# Patient Record
Sex: Male | Born: 1985 | Race: Black or African American | Hispanic: No | Marital: Married | Smoking: Current every day smoker
Health system: Southern US, Community
[De-identification: ages and names within clinical notes are randomized; demographics above are authoritative.]

## PROBLEM LIST (undated history)

## (undated) DIAGNOSIS — H00019 Hordeolum externum unspecified eye, unspecified eyelid: Secondary | ICD-10-CM

## (undated) DIAGNOSIS — L089 Local infection of the skin and subcutaneous tissue, unspecified: Secondary | ICD-10-CM

## (undated) DIAGNOSIS — S60459A Superficial foreign body of unspecified finger, initial encounter: Secondary | ICD-10-CM

---
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
## 2008-07-16 ENCOUNTER — Ambulatory Visit: Payer: Self-pay | Admitting: Family Medicine

## 2012-06-11 ENCOUNTER — Emergency Department (HOSPITAL_COMMUNITY): Payer: Self-pay

## 2012-06-11 ENCOUNTER — Encounter (HOSPITAL_COMMUNITY): Payer: Self-pay | Admitting: *Deleted

## 2012-06-11 ENCOUNTER — Emergency Department (HOSPITAL_COMMUNITY)
Admission: EM | Admit: 2012-06-11 | Discharge: 2012-06-11 | Disposition: A | Discharge status: Home / Self Care | Payer: Self-pay | Attending: Emergency Medicine | Admitting: Emergency Medicine

## 2012-06-11 DIAGNOSIS — M795 Residual foreign body in soft tissue: Secondary | ICD-10-CM | POA: Insufficient documentation

## 2012-06-11 DIAGNOSIS — S60459A Superficial foreign body of unspecified finger, initial encounter: Secondary | ICD-10-CM

## 2012-06-11 DIAGNOSIS — IMO0001 Reserved for inherently not codable concepts without codable children: Secondary | ICD-10-CM | POA: Insufficient documentation

## 2012-06-11 DIAGNOSIS — L02519 Cutaneous abscess of unspecified hand: Secondary | ICD-10-CM | POA: Insufficient documentation

## 2012-06-11 DIAGNOSIS — F17200 Nicotine dependence, unspecified, uncomplicated: Secondary | ICD-10-CM | POA: Insufficient documentation

## 2012-06-11 DIAGNOSIS — IMO0002 Reserved for concepts with insufficient information to code with codable children: Secondary | ICD-10-CM | POA: Insufficient documentation

## 2012-06-11 MED ORDER — CEPHALEXIN 500 MG PO CAPS: 500.0000 mg | ORAL_CAPSULE | Freq: Four times a day (QID) | ORAL | Status: DC
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
## 2012-06-12 NOTE — ED Provider Notes (Signed)
Medical screening examination/treatment/procedure(s) were performed by non-physician practitioner and as supervising physician I was immediately available for consultation/collaboration.  Italia Wolfert T Dhanvi Boesen, MD 06/12/12 1517 

## 2012-06-15 ENCOUNTER — Emergency Department (HOSPITAL_COMMUNITY): Payer: Medicaid Other | Admitting: Anesthesiology

## 2012-06-15 ENCOUNTER — Encounter (HOSPITAL_COMMUNITY): Admission: EM | Disposition: A | Discharge status: Home / Self Care | Payer: Self-pay | Source: Home / Self Care

## 2012-06-15 ENCOUNTER — Encounter (HOSPITAL_COMMUNITY): Payer: Self-pay | Admitting: *Deleted

## 2012-06-15 ENCOUNTER — Encounter (HOSPITAL_COMMUNITY): Payer: Self-pay | Admitting: Anesthesiology

## 2012-06-15 ENCOUNTER — Emergency Department (HOSPITAL_COMMUNITY)
Admission: EM | Admit: 2012-06-15 | Discharge: 2012-06-15 | Disposition: A | Discharge status: Home / Self Care | Payer: Medicaid Other | Attending: Orthopedic Surgery | Admitting: Orthopedic Surgery

## 2012-06-15 ENCOUNTER — Other Ambulatory Visit: Payer: Self-pay | Admitting: Orthopedic Surgery

## 2012-06-15 DIAGNOSIS — Z1812 Retained nonmagnetic metal fragments: Secondary | ICD-10-CM | POA: Insufficient documentation

## 2012-06-15 DIAGNOSIS — L02519 Cutaneous abscess of unspecified hand: Secondary | ICD-10-CM | POA: Insufficient documentation

## 2012-06-15 DIAGNOSIS — L03019 Cellulitis of unspecified finger: Secondary | ICD-10-CM | POA: Insufficient documentation

## 2012-06-15 DIAGNOSIS — M795 Residual foreign body in soft tissue: Secondary | ICD-10-CM | POA: Insufficient documentation

## 2012-06-15 HISTORY — DX: Local infection of the skin and subcutaneous tissue, unspecified: L08.9

## 2012-06-15 HISTORY — PX: INCISION AND DRAINAGE WOUND WITH FOREIGN BODY REMOVAL: SHX5635

## 2012-06-15 HISTORY — DX: Superficial foreign body of unspecified finger, initial encounter: S60.459A

## 2012-06-15 HISTORY — DX: Hordeolum externum unspecified eye, unspecified eyelid: H00.019

## 2012-06-15 SURGERY — INCISION AND DRAINAGE WOUND WITH FOREIGN BODY REMOVAL
Anesthesia: General | Site: Finger | Laterality: Left | Wound class: Clean

## 2012-06-15 MED ORDER — BUPIVACAINE HCL (PF) 0.25 % IJ SOLN
INTRAMUSCULAR | Status: AC
  Filled 2012-06-15: qty 30

## 2012-06-15 MED ORDER — PROPOFOL 10 MG/ML IV BOLUS
INTRAVENOUS | Status: DC | PRN
  Administered 2012-06-15: 200 mg via INTRAVENOUS
  Administered 2012-06-15: 50 mg via INTRAVENOUS

## 2012-06-15 MED ORDER — POTASSIUM CHLORIDE IN NACL 20-0.45 MEQ/L-% IV SOLN
INTRAVENOUS | Status: DC
  Administered 2012-06-15: 12:00:00 via INTRAVENOUS
  Filled 2012-06-15 (×2): qty 1000

## 2012-06-15 MED ORDER — CHLORHEXIDINE GLUCONATE 4 % EX LIQD
60.0000 mL | Freq: Once | CUTANEOUS | Status: AC
  Administered 2012-06-15: 4 via TOPICAL

## 2012-06-15 MED ORDER — MEPERIDINE HCL 25 MG/ML IJ SOLN: 6.2500 mg | INTRAMUSCULAR | Status: DC | PRN

## 2012-06-15 MED ORDER — LIDOCAINE HCL (CARDIAC) 20 MG/ML IV SOLN
INTRAVENOUS | Status: DC | PRN
  Administered 2012-06-15: 60 mg via INTRAVENOUS

## 2012-06-15 MED ORDER — FENTANYL CITRATE 0.05 MG/ML IJ SOLN
INTRAMUSCULAR | Status: DC | PRN
  Administered 2012-06-15: 50 ug via INTRAVENOUS
  Administered 2012-06-15: 100 ug via INTRAVENOUS

## 2012-06-15 MED ORDER — HYDROMORPHONE HCL PF 1 MG/ML IJ SOLN: 0.2500 mg | INTRAMUSCULAR | Status: DC | PRN

## 2012-06-15 MED ORDER — SULFAMETHOXAZOLE-TRIMETHOPRIM 800-160 MG PO TABS: 1.0000 | ORAL_TABLET | Freq: Two times a day (BID) | ORAL | Status: DC

## 2012-06-15 MED ORDER — CEFAZOLIN SODIUM-DEXTROSE 2-3 GM-% IV SOLR
2.0000 g | INTRAVENOUS | Status: AC
  Administered 2012-06-15: 2 g via INTRAVENOUS
  Filled 2012-06-15: qty 50

## 2012-06-15 MED ORDER — PROMETHAZINE HCL 25 MG/ML IJ SOLN: 6.2500 mg | INTRAMUSCULAR | Status: DC | PRN

## 2012-06-15 MED ORDER — MIDAZOLAM HCL 5 MG/5ML IJ SOLN
INTRAMUSCULAR | Status: DC | PRN
  Administered 2012-06-15: 2 mg via INTRAVENOUS

## 2012-06-15 MED ORDER — LACTATED RINGERS IV SOLN
INTRAVENOUS | Status: DC | PRN
  Administered 2012-06-15: 13:00:00 via INTRAVENOUS

## 2012-06-15 MED ORDER — HYDROCODONE-ACETAMINOPHEN 5-325 MG PO TABS: 1.0000 | ORAL_TABLET | Freq: Four times a day (QID) | ORAL | Status: DC | PRN

## 2012-06-15 MED ORDER — OXYCODONE HCL 5 MG PO TABS: 5.0000 mg | ORAL_TABLET | Freq: Once | ORAL | Status: DC | PRN

## 2012-06-15 MED ORDER — OXYCODONE HCL 5 MG/5ML PO SOLN: 5.0000 mg | Freq: Once | ORAL | Status: DC | PRN

## 2012-06-15 SURGICAL SUPPLY — 21 items
BNDG COHESIVE 1X5 TAN STRL LF (GAUZE/BANDAGES/DRESSINGS) ×2 IMPLANT
CLOTH BEACON ORANGE TIMEOUT ST (SAFETY) ×2 IMPLANT
COVER SURGICAL LIGHT HANDLE (MISCELLANEOUS) ×2 IMPLANT
DRAPE C-ARM MINI 42X72 WSTRAPS (DRAPES) ×2 IMPLANT
DRSG EMULSION OIL 3X3 NADH (GAUZE/BANDAGES/DRESSINGS) ×2 IMPLANT
DRSG TUBE GAUZE 1X5YD SZ2 (GAUZE/BANDAGES/DRESSINGS) ×2 IMPLANT
GLOVE BIO SURGEON STRL SZ8 (GLOVE) ×2 IMPLANT
GLOVE BIOGEL M STRL SZ7.5 (GLOVE) ×2 IMPLANT
GLOVE BIOGEL PI IND STRL 8 (GLOVE) ×1 IMPLANT
GLOVE BIOGEL PI INDICATOR 8 (GLOVE) ×1
GOWN STRL REIN XL XLG (GOWN DISPOSABLE) ×6 IMPLANT
KIT BASIN OR (CUSTOM PROCEDURE TRAY) ×2 IMPLANT
NEEDLE HYPO 25GX1X1/2 BEV (NEEDLE) IMPLANT
PACK ORTHO EXTREMITY (CUSTOM PROCEDURE TRAY) ×2 IMPLANT
SET CYSTO W/LG BORE CLAMP LF (SET/KITS/TRAYS/PACK) ×2 IMPLANT
SPONGE GAUZE 4X4 12PLY (GAUZE/BANDAGES/DRESSINGS) ×2 IMPLANT
SUCTION FRAZIER TIP 10 FR DISP (SUCTIONS) ×2 IMPLANT
SUT CHROMIC 5 0 RB 1 27 (SUTURE) ×2 IMPLANT
SYR CONTROL 10ML LL (SYRINGE) IMPLANT
TOWEL OR 17X24 6PK STRL BLUE (TOWEL DISPOSABLE) ×2 IMPLANT
TOWEL OR 17X26 10 PK STRL BLUE (TOWEL DISPOSABLE) ×2 IMPLANT
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
## 2012-06-15 NOTE — ED Notes (Signed)
Dr. Amanda Pea called x 2

## 2012-06-15 NOTE — Op Note (Signed)
See Dictation [id number] Christian Severin MD

## 2012-06-15 NOTE — Anesthesia Postprocedure Evaluation (Signed)
  Anesthesia Post-op Note  Patient: [name]  Procedure(s) Performed: Procedure(s) (LRB) with comments: INCISION AND DRAINAGE WOUND WITH FOREIGN BODY REMOVAL (Left) - index   Patient Location: PACU  Anesthesia Type:General  Level of Consciousness: awake  Airway and Oxygen Therapy: Patient Spontanous Breathing  Post-op Pain: none  Post-op Assessment: Post-op Vital signs reviewed  Post-op Vital Signs: stable  Complications: No apparent anesthesia complications

## 2012-06-15 NOTE — Anesthesia Preprocedure Evaluation (Addendum)
Anesthesia Evaluation  Patient identified by MRN, date of birth, ID band Patient awake    Reviewed: Allergy & Precautions, H&P , NPO status , Patient's Chart, lab work & pertinent test results  History of Anesthesia Complications Negative for: history of anesthetic complications  Airway Mallampati: I  Neck ROM: full    Dental No notable dental hx. (+) Teeth Intact   Pulmonary neg pulmonary ROS,  breath sounds clear to auscultation  Pulmonary exam normal       Cardiovascular negative cardio ROS  IRhythm:regular Rate:Normal     Neuro/Psych negative neurological ROS  negative psych ROS   GI/Hepatic negative GI ROS, Neg liver ROS,   Endo/Other  negative endocrine ROS  Renal/GU negative Renal ROS  negative genitourinary   Musculoskeletal   Abdominal   Peds  Hematology negative hematology ROS (+)   Anesthesia Other Findings   Reproductive/Obstetrics negative OB ROS                           Anesthesia Physical Anesthesia Plan  ASA: I and emergent  Anesthesia Plan: General   Post-op Pain Management:    Induction:   Airway Management Planned:   Additional Equipment:   Intra-op Plan:   Post-operative Plan:   Informed Consent: I have reviewed the patients History and Physical, chart, labs and discussed the procedure including the risks, benefits and alternatives for the proposed anesthesia with the patient or authorized representative who has indicated his/her understanding and acceptance.     Plan Discussed with: CRNA and Surgeon  Anesthesia Plan Comments:         Anesthesia Quick Evaluation

## 2012-06-15 NOTE — Anesthesia Procedure Notes (Signed)
Procedure Name: LMA Insertion Date/Time: 06/15/2012 1:24 PM Performed by: Alanda Amass A Pre-anesthesia Checklist: Patient identified, Timeout performed, Emergency Drugs available, Suction available and Patient being monitored Patient Re-evaluated:Patient Re-evaluated prior to inductionOxygen Delivery Method: Circle system utilized Preoxygenation: Pre-oxygenation with 100% oxygen Intubation Type: IV induction LMA: LMA inserted LMA Size: 5.0 Number of attempts: 1 Placement Confirmation: positive ETCO2 and breath sounds checked- equal and bilateral Tube secured with: Tape Dental Injury: Teeth and Oropharynx as per pre-operative assessment
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
## 2012-06-15 NOTE — ED Notes (Signed)
Called to the OR and left a message for Dr. Amanda Pea to call before going into surgery.
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
## 2012-06-15 NOTE — Transfer of Care (Signed)
Immediate Anesthesia Transfer of Care Note  Patient: [name]  Procedure(s) Performed: Procedure(s) (LRB) with comments: INCISION AND DRAINAGE WOUND WITH FOREIGN BODY REMOVAL (Left) - index   Patient Location: PACU  Anesthesia Type:General  Level of Consciousness: sedated  Airway & Oxygen Therapy: Patient Spontanous Breathing  Post-op Assessment: Report given to PACU RN  Post vital signs: Reviewed and stable  Complications: No apparent anesthesia complications

## 2012-06-17 ENCOUNTER — Encounter (HOSPITAL_COMMUNITY): Payer: Self-pay | Admitting: Orthopedic Surgery

## 2012-06-18 LAB — CULTURE, ROUTINE-ABSCESS: Culture: NO GROWTH

## 2012-06-20 LAB — ANAEROBIC CULTURE

## 2017-07-26 ENCOUNTER — Emergency Department (HOSPITAL_COMMUNITY): Payer: BLUE CROSS/BLUE SHIELD

## 2017-07-26 ENCOUNTER — Encounter (HOSPITAL_COMMUNITY): Payer: Self-pay | Admitting: Emergency Medicine

## 2017-07-26 ENCOUNTER — Emergency Department (HOSPITAL_COMMUNITY)
Admission: EM | Admit: 2017-07-26 | Discharge: 2017-07-26 | Disposition: A | Discharge status: Home / Self Care | Payer: BLUE CROSS/BLUE SHIELD | Attending: Emergency Medicine | Admitting: Emergency Medicine

## 2017-07-26 DIAGNOSIS — J111 Influenza due to unidentified influenza virus with other respiratory manifestations: Secondary | ICD-10-CM | POA: Diagnosis not present

## 2017-07-26 DIAGNOSIS — Z79899 Other long term (current) drug therapy: Secondary | ICD-10-CM | POA: Diagnosis not present

## 2017-07-26 DIAGNOSIS — F17210 Nicotine dependence, cigarettes, uncomplicated: Secondary | ICD-10-CM | POA: Insufficient documentation

## 2017-07-26 DIAGNOSIS — R0602 Shortness of breath: Secondary | ICD-10-CM | POA: Diagnosis present

## 2017-07-26 LAB — INFLUENZA PANEL BY PCR (TYPE A & B)
Influenza A By PCR: POSITIVE — AB
Influenza B By PCR: NEGATIVE

## 2017-07-26 MED ORDER — IBUPROFEN 800 MG PO TABS
800.0000 mg | ORAL_TABLET | Freq: Once | ORAL | Status: AC
Start: 2017-07-26 — End: 2017-07-26
  Administered 2017-07-26: 800 mg via ORAL
  Filled 2017-07-26: qty 1

## 2017-07-26 MED ORDER — ACETAMINOPHEN 325 MG PO TABS
650.0000 mg | ORAL_TABLET | Freq: Once | ORAL | Status: AC | PRN
  Administered 2017-07-26: 650 mg via ORAL
  Filled 2017-07-26: qty 2

## 2017-07-26 MED ORDER — OSELTAMIVIR PHOSPHATE 75 MG PO CAPS
75.0000 mg | ORAL_CAPSULE | Freq: Once | ORAL | Status: AC
  Administered 2017-07-26: 75 mg via ORAL
  Filled 2017-07-26: qty 1

## 2017-07-26 MED ORDER — SODIUM CHLORIDE 0.9 % IV BOLUS (SEPSIS)
1000.0000 mL | Freq: Once | INTRAVENOUS | Status: AC
  Administered 2017-07-26: 1000 mL via INTRAVENOUS

## 2017-07-26 MED ORDER — OSELTAMIVIR PHOSPHATE 75 MG PO CAPS: 75.0000 mg | ORAL_CAPSULE | Freq: Two times a day (BID) | ORAL | 0 refills | Status: DC

## 2017-07-26 MED ORDER — IPRATROPIUM-ALBUTEROL 0.5-2.5 (3) MG/3ML IN SOLN
3.0000 mL | Freq: Once | RESPIRATORY_TRACT | Status: AC
  Administered 2017-07-26: 3 mL via RESPIRATORY_TRACT
  Filled 2017-07-26: qty 3

## 2017-07-26 NOTE — ED Triage Notes (Signed)
Ptpresents to ED for assessment of shortness of breath with exertion, facial rash, fevers, body aches, headache, cough with phlegm.  Decrease in appetite, not eating or drinking the past few days.  Febrile and tachycardic at triage.

## 2017-07-26 NOTE — ED Notes (Addendum)
Once pt finished breathing treatment, pt ambulated in the hallway. Pt's O2 was 91%-93%. Informed Lynnze - RN.

## 2017-07-26 NOTE — ED Provider Notes (Signed)
[facility]   CSN: [id number] Arrival date & time: 07/26/17  1155  History   Chief Complaint Chief Complaint  Patient presents with  . Shortness of Breath    HPI [name] is a 32 y.o. male.  HPI    32 year old male presents today with complaints of flulike symptoms.  Patient notes a significant past medical history of asthma, reports that he does not generally use an inhaler.  He reports 3 weeks ago he had an upper respiratory infection that resolved with steroids and breathing treatment.  Patient notes that he was in his usual state of health with very minimal wheezing over the last week.  He notes 2 days ago he had acute onset of fever, body aches, runny nose congestion and cough.  Patient has had decreased appetite with very little p.o. intake.  He notes taking Tylenol several hours prior to evaluation.  Patient notes that he does not have an inhaler at home as he does not need this consistently.  Patient denies any other significant past medical history.  Patient reports he did not get the flu vaccine this year.  Patient notes a rash to his face, he reports this typically presents with respiratory infections.    Past Medical History:  Diagnosis Date  . Foreign body in finger-infected 2000  . Stye     There are no active problems to display for this patient.   Past Surgical History:  Procedure Laterality Date  . INCISION AND DRAINAGE WOUND WITH FOREIGN BODY REMOVAL  06/15/2012   Procedure: INCISION AND DRAINAGE WOUND WITH FOREIGN BODY REMOVAL;  Surgeon: Dominica SeverinWilliam Gramig, MD;  Location: [facility] OR;  Service: Orthopedics;  Laterality: Left;  index       Home Medications    Prior to Admission medications   Medication Sig Start Date End Date Taking? Authorizing Provider  cephALEXin (KEFLEX) 500 MG capsule Take 1 capsule (500 mg total) by mouth 4 (four) times daily. 06/11/12   Santiago GladLaisure, Heather, PA-C    HYDROcodone-acetaminophen (NORCO) 5-325 MG per tablet Take 1 tablet by mouth every 6 (six) hours as needed for pain. 06/15/12   Dominica SeverinGramig, William, MD  oseltamivir (TAMIFLU) 75 MG capsule Take 1 capsule (75 mg total) by mouth every 12 (twelve) hours. 07/26/17   Deairra Halleck, Tinnie GensJeffrey, PA-C  sulfamethoxazole-trimethoprim (BACTRIM DS) 800-160 MG per tablet Take 1 tablet by mouth 2 (two) times daily. 06/15/12   Dominica SeverinGramig, William, MD    Family History History reviewed. No pertinent family history.  Social History Social History   Tobacco Use  . Smoking status: Current Every Day Smoker    Packs/day: 0.25    Years: 2.00    Pack years: 0.50    Types: Cigarettes  . Smokeless tobacco: Never Used  Substance Use Topics  . Alcohol use: Yes    Comment: 1 drink/ day  . Drug use: No     Allergies   Patient has no known allergies.   Review of Systems Review of Systems  All other systems reviewed and are negative.  Physical Exam Updated Vital Signs BP 123/83 (BP Location: Left Arm)   Pulse 83   Temp 98.2 F (36.8 C) (Oral)   Resp 16   SpO2 94%   Physical Exam  Constitutional: He is oriented to person, place, and time. He appears well-developed and well-nourished.  HENT:  Head: Normocephalic and atraumatic.  Pruritic rash noted to face  Eyes: Conjunctivae are normal. Pupils are equal, round,  and reactive to light. Right eye exhibits no discharge. Left eye exhibits no discharge. No scleral icterus.  Neck: Normal range of motion. No JVD present. No tracheal deviation present.  Pulmonary/Chest: Effort normal and breath sounds normal. No stridor. No respiratory distress. He has no wheezes. He has no rales. He exhibits no tenderness.  Neurological: He is alert and oriented to person, place, and time. Coordination normal.  Psychiatric: He has a normal mood and affect. His behavior is normal. Judgment and thought content normal.  Nursing note and vitals reviewed.    ED Treatments / Results   Labs (all labs ordered are listed, but only abnormal results are displayed) Labs Reviewed  INFLUENZA PANEL BY PCR (TYPE A & B) - Abnormal; Notable for the following components:      Result Value   Influenza A By PCR POSITIVE (*)    All other components within normal limits    EKG  EKG Interpretation None       Radiology Dg Chest 2 View  Result Date: 07/26/2017 CLINICAL DATA:  Chest tightness, shortness of breath, fever, chills, and body aches for the past 2 days. Wheezing for the past 2 weeks. Three days of headache. Patient is a current smoker and has history of asthma. EXAM: CHEST  2 VIEW COMPARISON:  Chest x-ray of July 16, 2008 FINDINGS: The lungs are well-expanded. There is mild interstitial prominence bilaterally. There is no alveolar infiltrate or pleural effusion. The heart and mediastinal structures are normal. The bony thorax exhibits no acute abnormality. IMPRESSION: Mild interstitial prominence likely reflects the history of reactive airway disease and smoking. No alveolar pneumonia nor other acute cardiopulmonary abnormality. Electronically Signed   By: David  [place] M.D.   On: 07/26/2017 12:52    Procedures Procedures (including critical care time)  Medications Ordered in ED Medications  acetaminophen (TYLENOL) tablet 650 mg (650 mg Oral Given 07/26/17 1227)  ibuprofen (ADVIL,MOTRIN) tablet 800 mg (800 mg Oral Given 07/26/17 1530)  ipratropium-albuterol (DUONEB) 0.5-2.5 (3) MG/3ML nebulizer solution 3 mL (3 mLs Nebulization Given 07/26/17 1530)  sodium chloride 0.9 % bolus 1,000 mL (0 mLs Intravenous Stopped 07/26/17 1626)  oseltamivir (TAMIFLU) capsule 75 mg (75 mg Oral Given 07/26/17 1735)  ipratropium-albuterol (DUONEB) 0.5-2.5 (3) MG/3ML nebulizer solution 3 mL (3 mLs Nebulization Given 07/26/17 1735)     Initial Impression / Assessment and Plan / ED Course  I have reviewed the triage vital signs and the nursing notes.  Pertinent labs & imaging results that  were available during my care of the patient were reviewed by me and considered in my medical decision making (see chart for details).      Final Clinical Impressions(s) / ED Diagnoses   Final diagnoses:  Influenza    Labs:   Imaging: DG chest 2 view   Consults:  Therapeutics: DuoNeb x2, Tamiflu  Discharge Meds: Tamiflu  Assessment/Plan: 32 year old male presents today with likely influenza.  Patient is in no acute distress, slightly tachycardic and febrile.  Patient does have a history of asthma, he will be tested and prophylactically started on Tamiflu pending his results.  Patient's chest x-rays are reassuring with no signs of acute pneumonia.  He has reassuring lung sounds.  Patient requesting a breathing treatment, he will be given this, reassessed with likely disposition home.  Patient reassessed reports he is feeling much better.  He has clear lung sounds again, O2 sensor was adjusted with 98% oxygen saturation no respiratory distress.  Patient ambulating the hall without dyspnea.  Patient is flu positive he will be treated with Tamiflu, patient's family at bedside, he is given strict return precautions, he verbalized understanding and agreement to today's plan had no further questions or concerns at time of discharge.         ED Discharge Orders        Ordered    oseltamivir (TAMIFLU) 75 MG capsule  Every 12 hours     07/26/17 1717       Eyvonne Mechanic, PA-C 07/26/17 1857    Nazareth Kirk, Tinnie Gens, PA-C 07/26/17 1917    Mancel Bale, MD 07/27/17 1124

## 2017-07-26 NOTE — Discharge Instructions (Signed)
Please read attached information. If you experience any new or worsening signs or symptoms please return to the emergency room for evaluation. Please follow-up with your primary care provider or specialist as discussed. Please use medication prescribed only as directed and discontinue taking if you have any concerning signs or symptoms.   °

## 2017-07-26 NOTE — ED Notes (Signed)
Pt ambulated to room with no difficulty.  

## 2017-08-09 ENCOUNTER — Other Ambulatory Visit: Payer: Self-pay | Admitting: Obstetrics and Gynecology

## 2017-08-09 DIAGNOSIS — J45909 Unspecified asthma, uncomplicated: Secondary | ICD-10-CM

## 2017-08-10 ENCOUNTER — Ambulatory Visit: Payer: BLUE CROSS/BLUE SHIELD

## 2017-08-10 DIAGNOSIS — J45909 Unspecified asthma, uncomplicated: Secondary | ICD-10-CM

## 2017-08-10 LAB — PULMONARY FUNCTION TEST
DL/VA % pred: 101 %
DL/VA: 4.9 ml/min/mmHg/L
DLCO UNC % PRED: 75 %
DLCO unc: 27.43 ml/min/mmHg
FEF 25-75 PRE: 1.1 L/s
FEF 25-75 Post: 1.3 L/sec
FEF2575-%CHANGE-POST: 17 %
FEF2575-%PRED-POST: 29 %
FEF2575-%Pred-Pre: 25 %
FEV1-%CHANGE-POST: 5 %
FEV1-%Pred-Post: 55 %
FEV1-%Pred-Pre: 52 %
FEV1-POST: 2.3 L
FEV1-Pre: 2.18 L
FEV1FVC-%CHANGE-POST: 0 %
FEV1FVC-%Pred-Pre: 68 %
FEV6-%CHANGE-POST: 6 %
FEV6-%Pred-Post: 82 %
FEV6-%Pred-Pre: 77 %
FEV6-PRE: 3.82 L
FEV6-Post: 4.07 L
FEV6FVC-%Change-Post: 0 %
FEV6FVC-%PRED-PRE: 100 %
FEV6FVC-%Pred-Post: 100 %
FVC-%CHANGE-POST: 6 %
FVC-%PRED-POST: 81 %
FVC-%Pred-Pre: 77 %
FVC-PRE: 3.84 L
FVC-Post: 4.08 L
POST FEV1/FVC RATIO: 56 %
Post FEV6/FVC ratio: 100 %
Pre FEV1/FVC ratio: 57 %
Pre FEV6/FVC Ratio: 99 %
RV % PRED: 150 %
RV: 2.77 L
TLC % pred: 87 %
TLC: 6.58 L

## 2018-08-07 ENCOUNTER — Ambulatory Visit (HOSPITAL_COMMUNITY)
Admission: EM | Admit: 2018-08-07 | Discharge: 2018-08-07 | Disposition: A | Discharge status: Home / Self Care | Payer: BLUE CROSS/BLUE SHIELD | Attending: Family Medicine | Admitting: Family Medicine

## 2018-08-07 ENCOUNTER — Encounter (HOSPITAL_COMMUNITY): Payer: Self-pay | Admitting: Emergency Medicine

## 2018-08-07 ENCOUNTER — Other Ambulatory Visit: Payer: Self-pay

## 2018-08-07 DIAGNOSIS — M62838 Other muscle spasm: Secondary | ICD-10-CM

## 2018-08-07 DIAGNOSIS — M542 Cervicalgia: Secondary | ICD-10-CM | POA: Diagnosis not present

## 2018-08-07 MED ORDER — DICLOFENAC SODIUM 75 MG PO TBEC: 75.0000 mg | DELAYED_RELEASE_TABLET | Freq: Two times a day (BID) | ORAL | 0 refills | Status: DC

## 2018-08-07 MED ORDER — DIAZEPAM 10 MG PO TABS: 10.0000 mg | ORAL_TABLET | Freq: Every evening | ORAL | 0 refills | Status: DC | PRN

## 2018-08-07 NOTE — ED Provider Notes (Signed)
[facility]   [id number] 08/07/18 Arrival Time: 1428  ASSESSMENT & PLAN:  1. Neck pain   2. Muscle spasm    Meds ordered this encounter  Medications  . diazepam (VALIUM) 10 MG tablet    Sig: Take 1 tablet (10 mg total) by mouth at bedtime as needed (muscle spasm).    Dispense:  7 tablet    Refill:  0  . diclofenac (VOLTAREN) 75 MG EC tablet    Sig: Take 1 tablet (75 mg total) by mouth 2 (two) times daily.    Dispense:  14 tablet    Refill:  0    Follow-up Information    [facility].   Specialty:  Urgent Care Why:  If not improving over the next week. Contact information: [address] [place] [address] [phone]         Encourage ROM as tolerated.  [place] Controlled Substances Registry consulted for this patient. I feel the risk/benefit ratio today is favorable for proceeding with this prescription for a controlled substance. Medication sedation precautions given.  Reviewed expectations re: course of current medical issues. Questions answered. Outlined signs and symptoms indicating need for more acute intervention. Patient verbalized understanding. After Visit Summary given.  SUBJECTIVE: History from: patient. [name] is a 33 y.o. male who reports persistent mild to moderate pain of his right neck; described as "stiffness" without radiation. Onset: abrupt, several days ago. Injury/trama: no. Symptoms have progressed to a point and plateaued since beginning. Aggravating factors: movement. Alleviating factors: nothing identified. Associated symptoms: none reported. Extremity sensation changes or weakness: none. Self treatment: tried OTCs without relief of pain in addition to topical IcyHot. History of similar: no. No headaches or visual changes. Afebrile. Ambulatory without difficulty.  Past Surgical History:  Procedure Laterality Date  . INCISION AND DRAINAGE WOUND WITH FOREIGN BODY REMOVAL   06/15/2012   Procedure: INCISION AND DRAINAGE WOUND WITH FOREIGN BODY REMOVAL;  Surgeon: Dominica Severin, MD;  Location: [facility];  Service: Orthopedics;  Laterality: Left;  index      ROS: As per HPI. All other systems negative.    OBJECTIVE:  Vitals:   08/07/18 1454  BP: (!) 116/93  Pulse: 80  Temp: 98.3 F (36.8 C)  TempSrc: Oral  SpO2: 99%    General appearance: alert; no distress HEENT: [place]; AT; PERRLA; EOMI Neck: supple with FROM but with R-sided soreness reported on movement of neck; tender to R posterior neck musculature; no midline tenderness Extremities: moving all extremities normally; no edema CV: brisk extremity capillary refill of RUE and LUE; 2+ raidal pulse of RUE and LUE. Skin: warm and dry; no visible rashes Neurologic: gait normal; normal reflexes of RUE and LUE; normal sensation of RUE and LUE; normal strength of RUE and LUE Psychological: alert and cooperative; normal mood and affect  No Known Allergies  Past Medical History:  Diagnosis Date  . Foreign body in finger-infected 2000  . Stye    Social History   Socioeconomic History  . Marital status: Married    Spouse name: Not on file  . Number of children: Not on file  . Years of education: Not on file  . Highest education level: Not on file  Occupational History  . Not on file  Social Needs  . Financial resource strain: Not on file  . Food insecurity:    Worry: Not on file    Inability: Not on file  . Transportation needs:  Medical: Not on file    Non-medical: Not on file  Tobacco Use  . Smoking status: Current Every Day Smoker    Packs/day: 0.25    Years: 2.00    Pack years: 0.50    Types: Cigarettes  . Smokeless tobacco: Never Used  Substance and Sexual Activity  . Alcohol use: Yes    Comment: 1 drink/ day  . Drug use: No  . Sexual activity: Yes  Lifestyle  . Physical activity:    Days per week: Not on file    Minutes per session: Not on file  . Stress: Not on file    Relationships  . Social connections:    Talks on phone: Not on file    Gets together: Not on file    Attends religious service: Not on file    Active member of club or organization: Not on file    Attends meetings of clubs or organizations: Not on file    Relationship status: Not on file  Other Topics Concern  . Not on file  Social History Narrative  . Not on file   FH: Question of HTN.  Past Surgical History:  Procedure Laterality Date  . INCISION AND DRAINAGE WOUND WITH FOREIGN BODY REMOVAL  06/15/2012   Procedure: INCISION AND DRAINAGE WOUND WITH FOREIGN BODY REMOVAL;  Surgeon: Dominica Severin, MD;  Location: [facility];  Service: Orthopedics;  Laterality: Left;  index       Mardella Layman, MD 08/20/18 [phone]

## 2018-08-07 NOTE — ED Triage Notes (Signed)
Pt woke up Thursday morning with a stiff neck.  Since then he has had constant right sided neck pain that radiates into his shoulder.  Pt has been taking 800mg  Ibuprofen and IcyHot with no relief.

## 2019-02-07 ENCOUNTER — Other Ambulatory Visit: Payer: Self-pay

## 2019-02-07 ENCOUNTER — Encounter (HOSPITAL_COMMUNITY): Payer: Self-pay

## 2019-02-07 ENCOUNTER — Ambulatory Visit (HOSPITAL_COMMUNITY)
Admission: EM | Admit: 2019-02-07 | Discharge: 2019-02-07 | Disposition: A | Discharge status: Home / Self Care | Payer: BC Managed Care – PPO | Attending: Emergency Medicine | Admitting: Emergency Medicine

## 2019-02-07 DIAGNOSIS — B356 Tinea cruris: Secondary | ICD-10-CM

## 2019-02-07 MED ORDER — TERBINAFINE HCL 250 MG PO TABS: 250.0000 mg | ORAL_TABLET | Freq: Every day | ORAL | 0 refills | Status: AC

## 2019-02-07 NOTE — Discharge Instructions (Signed)
°  Please follow up with family medicine in 1 week if not improving. °

## 2019-02-07 NOTE — ED Triage Notes (Signed)
Pt presents to UC with c/o rash in groin area since 4 weeks ago. Pt reports using OTC jock itch powder without relief. Denies discharge or bleeding from rash.

## 2019-02-07 NOTE — ED Provider Notes (Signed)
[place]    CSN: [id number] Arrival date & time: 02/07/19  1728      History   Chief Complaint Chief Complaint  Patient presents with  . Rash    HPI [name] is a 33 y.o. male.   HPI [name] is a 33 y.o. male presenting to UC with c/o itching and mild burning rash in his Left and Right groin c/w prior episodes of jock itch but more severe.  He has tried OTC powder w/o relief. Denies bleeding or discharge from the rash. Denies concern for STIs. No penile discharge or dysuria.     Past Medical History:  Diagnosis Date  . Foreign body in finger-infected 2000  . Stye     There are no active problems to display for this patient.   Past Surgical History:  Procedure Laterality Date  . INCISION AND DRAINAGE WOUND WITH FOREIGN BODY REMOVAL  06/15/2012   Procedure: INCISION AND DRAINAGE WOUND WITH FOREIGN BODY REMOVAL;  Surgeon: Roseanne Kaufman, MD;  Location: [facility];  Service: Orthopedics;  Laterality: Left;  index        Home Medications    Prior to Admission medications   Medication Sig Start Date End Date Taking? Authorizing Provider  cephALEXin (KEFLEX) 500 MG capsule Take 1 capsule (500 mg total) by mouth 4 (four) times daily. 06/11/12   Hyman Bible, PA-C  diazepam (VALIUM) 10 MG tablet Take 1 tablet (10 mg total) by mouth at bedtime as needed (muscle spasm). 08/07/18   Vanessa Kick, MD  diclofenac (VOLTAREN) 75 MG EC tablet Take 1 tablet (75 mg total) by mouth 2 (two) times daily. 08/07/18   Vanessa Kick, MD  HYDROcodone-acetaminophen (NORCO) 5-325 MG per tablet Take 1 tablet by mouth every 6 (six) hours as needed for pain. 06/15/12   Roseanne Kaufman, MD  oseltamivir (TAMIFLU) 75 MG capsule Take 1 capsule (75 mg total) by mouth every 12 (twelve) hours. 07/26/17   Hedges, Dellis Filbert, PA-C  sulfamethoxazole-trimethoprim (BACTRIM DS) 800-160 MG per tablet Take 1 tablet by mouth 2 (two) times daily. 06/15/12   Roseanne Kaufman, MD  terbinafine (LAMISIL)  250 MG tablet Take 1 tablet (250 mg total) by mouth daily for 10 days. 02/07/19 02/17/19  Noe Gens, PA-C    Family History Family History  Problem Relation Age of Onset  . Cancer Mother   . Diabetes Father     Social History Social History   Tobacco Use  . Smoking status: Current Every Day Smoker    Years: 2.00    Types: Cigars  . Smokeless tobacco: Never Used  . Tobacco comment: 3 cigars a day  Substance Use Topics  . Alcohol use: Yes    Comment: 2 drink/ day  . Drug use: No     Allergies   Patient has no known allergies.   Review of Systems Review of Systems  Gastrointestinal: Negative for abdominal pain, diarrhea, nausea and vomiting.  Genitourinary: Negative for discharge, dysuria, frequency, penile pain, scrotal swelling and testicular pain.  Skin: Positive for rash. Negative for wound.  Neurological: Negative for dizziness, light-headedness and headaches.     Physical Exam Triage Vital Signs ED Triage Vitals  Enc Vitals Group     BP 02/07/19 1741 129/77     Pulse Rate 02/07/19 1741 88     Resp 02/07/19 1741 16     Temp 02/07/19 1741 98.3 F (36.8 C)     Temp Source 02/07/19 1741 Oral  SpO2 02/07/19 1741 99 %     Weight --      Height --      Head Circumference --      Peak Flow --      Pain Score 02/07/19 1742 1     Pain Loc --      Pain Edu? --      Excl. in GC? --    No data found.  Updated Vital Signs BP 129/77 (BP Location: Right Arm)   Pulse 88   Temp 98.3 F (36.8 C) (Oral)   Resp 16   SpO2 99%   Visual Acuity Right Eye Distance:   Left Eye Distance:   Bilateral Distance:    Right Eye Near:   Left Eye Near:    Bilateral Near:     Physical Exam Vitals signs and nursing note reviewed. Exam conducted with a chaperone present.  Constitutional:      Appearance: Normal appearance. He is well-developed.  HENT:     Head: Normocephalic and atraumatic.  Neck:     Musculoskeletal: Normal range of motion.  Cardiovascular:      Rate and Rhythm: Normal rate and regular rhythm.  Pulmonary:     Effort: Pulmonary effort is normal.     Breath sounds: Normal breath sounds.  Genitourinary:    Pubic Area: Rash ( bilateral groin, mild erythematous macular well defined border. no active bleeding or drainage) present.     Penis: Normal.     Musculoskeletal: Normal range of motion.  Skin:    General: Skin is warm and dry.  Neurological:     Mental Status: He is alert and oriented to person, place, and time.  Psychiatric:        Behavior: Behavior normal.      UC Treatments / Results  Labs (all labs ordered are listed, but only abnormal results are displayed) Labs Reviewed - No data to display  EKG   Radiology No results found.  Procedures Procedures (including critical care time)  Medications Ordered in UC Medications - No data to display  Initial Impression / Assessment and Plan / UC Course  I have reviewed the triage vital signs and the nursing notes.  Pertinent labs & imaging results that were available during my care of the patient were reviewed by me and considered in my medical decision making (see chart for details).     Exam c/w tinea Will start on oral terbinafine given failed topical treatment. AVS provided  Final Clinical Impressions(s) / UC Diagnoses   Final diagnoses:  Tinea cruris     Discharge Instructions      Please follow up with family medicine in 1 week if not improving.     ED Prescriptions    Medication Sig Dispense Auth. Provider   terbinafine (LAMISIL) 250 MG tablet Take 1 tablet (250 mg total) by mouth daily for 10 days. 10 tablet Lurene ShadowPhelps, Pavneet Markwood O, PA-C     Controlled Substance Prescriptions [place] Controlled Substance Registry consulted? Not Applicable   Rolla Platehelps, Messi Twedt O, PA-C 02/07/19 [phone]

## 2019-07-15 ENCOUNTER — Encounter (HOSPITAL_COMMUNITY): Payer: Self-pay

## 2019-07-15 ENCOUNTER — Other Ambulatory Visit: Payer: Self-pay

## 2019-07-15 ENCOUNTER — Ambulatory Visit (INDEPENDENT_AMBULATORY_CARE_PROVIDER_SITE_OTHER): Payer: BC Managed Care – PPO

## 2019-07-15 ENCOUNTER — Ambulatory Visit (HOSPITAL_COMMUNITY)
Admission: EM | Admit: 2019-07-15 | Discharge: 2019-07-15 | Disposition: A | Discharge status: Home / Self Care | Payer: BC Managed Care – PPO | Attending: Family Medicine | Admitting: Family Medicine

## 2019-07-15 DIAGNOSIS — R05 Cough: Secondary | ICD-10-CM | POA: Diagnosis not present

## 2019-07-15 DIAGNOSIS — R0989 Other specified symptoms and signs involving the circulatory and respiratory systems: Secondary | ICD-10-CM | POA: Diagnosis not present

## 2019-07-15 DIAGNOSIS — R0602 Shortness of breath: Secondary | ICD-10-CM | POA: Insufficient documentation

## 2019-07-15 DIAGNOSIS — F17210 Nicotine dependence, cigarettes, uncomplicated: Secondary | ICD-10-CM | POA: Diagnosis not present

## 2019-07-15 DIAGNOSIS — Z20822 Contact with and (suspected) exposure to covid-19: Secondary | ICD-10-CM | POA: Insufficient documentation

## 2019-07-15 DIAGNOSIS — J209 Acute bronchitis, unspecified: Secondary | ICD-10-CM | POA: Diagnosis not present

## 2019-07-15 DIAGNOSIS — Z79899 Other long term (current) drug therapy: Secondary | ICD-10-CM | POA: Diagnosis not present

## 2019-07-15 MED ORDER — GUAIFENESIN 100 MG/5ML PO LIQD: 100.0000 mg | ORAL | 0 refills | Status: DC | PRN

## 2019-07-15 MED ORDER — PREDNISONE 10 MG PO TABS: 40.0000 mg | ORAL_TABLET | Freq: Every day | ORAL | 0 refills | Status: AC

## 2019-07-15 MED ORDER — ALBUTEROL SULFATE HFA 108 (90 BASE) MCG/ACT IN AERS: 1.0000 | INHALATION_SPRAY | Freq: Four times a day (QID) | RESPIRATORY_TRACT | 0 refills | Status: DC | PRN

## 2019-07-15 NOTE — ED Triage Notes (Signed)
Pt presents with productive cough and congestion X 4 days.

## 2019-07-15 NOTE — Discharge Instructions (Addendum)
Treating you for bronchitis. No pneumonia on x-ray Take the medication as prescribed.  If your symptoms continue or worsen you need to follow-up.

## 2019-07-16 NOTE — ED Provider Notes (Signed)
[facility]    CSN: [id number] Arrival date & time: 07/15/19  1000      History   Chief Complaint Chief Complaint  Patient presents with  . Appointment  . (10:00 am COUGH & CONGESTION)    HPI LESTAT GOLOB is a 34 y.o. male.   Patient is a 34 year old male with no significant past medical history.  He presents today with productive cough and congestion x4 days.  Symptoms have been constant.  Has been coughing up yellow-tinged sputum.  Mild shortness of breath.  Current everyday smoker.  No associated fever, chills, body aches, night sweats, loss of taste or smell, diarrhea.  Has not taken anything for his symptoms.  No recent sick contacts or recent traveling.  ROS per HPI      Past Medical History:  Diagnosis Date  . Foreign body in finger-infected 2000  . Stye     There are no problems to display for this patient.   Past Surgical History:  Procedure Laterality Date  . INCISION AND DRAINAGE WOUND WITH FOREIGN BODY REMOVAL  06/15/2012   Procedure: INCISION AND DRAINAGE WOUND WITH FOREIGN BODY REMOVAL;  Surgeon: Dominica Severin, MD;  Location: [facility];  Service: Orthopedics;  Laterality: Left;  index        Home Medications    Prior to Admission medications   Medication Sig Start Date End Date Taking? Authorizing Provider  albuterol (VENTOLIN HFA) 108 (90 Base) MCG/ACT inhaler Inhale 1-2 puffs into the lungs every 6 (six) hours as needed for wheezing or shortness of breath. 07/15/19   Dahlia Byes A, NP  guaiFENesin (ROBITUSSIN) 100 MG/5ML liquid Take 5-10 mLs (100-200 mg total) by mouth every 4 (four) hours as needed for cough. 07/15/19   Dahlia Byes A, NP  predniSONE (DELTASONE) 10 MG tablet Take 4 tablets (40 mg total) by mouth daily for 5 days. 07/15/19 07/20/19  Janace Aris, NP    Family History Family History  Problem Relation Age of Onset  . Cancer Mother   . Diabetes Father     Social History Social History   Tobacco Use  . Smoking status:  Current Every Day Smoker    Years: 2.00    Types: Cigars  . Smokeless tobacco: Never Used  . Tobacco comment: 3 cigars a day  Substance Use Topics  . Alcohol use: Yes    Comment: 2 drink/ day  . Drug use: No     Allergies   Patient has no known allergies.   Review of Systems Review of Systems   Physical Exam Triage Vital Signs ED Triage Vitals  Enc Vitals Group     BP 07/15/19 1030 138/81     Pulse Rate 07/15/19 1030 72     Resp 07/15/19 1030 17     Temp 07/15/19 1030 98.4 F (36.9 C)     Temp Source 07/15/19 1030 Oral     SpO2 07/15/19 1030 99 %     Weight --      Height --      Head Circumference --      Peak Flow --      Pain Score 07/15/19 1028 2     Pain Loc --      Pain Edu? --      Excl. in GC? --    No data found.  Updated Vital Signs BP 138/81 (BP Location: Right Arm)   Pulse 72   Temp 98.4 F (36.9 C) (Oral)  Resp 17   SpO2 99%   Visual Acuity Right Eye Distance:   Left Eye Distance:   Bilateral Distance:    Right Eye Near:   Left Eye Near:    Bilateral Near:     Physical Exam Vitals and nursing note reviewed.  Constitutional:      General: He is not in acute distress.    Appearance: Normal appearance. He is not ill-appearing, toxic-appearing or diaphoretic.  HENT:     Head: Normocephalic and atraumatic.     Nose: Nose normal.  Eyes:     Conjunctiva/sclera: Conjunctivae normal.  Cardiovascular:     Rate and Rhythm: Normal rate and regular rhythm.  Pulmonary:     Effort: Pulmonary effort is normal.     Breath sounds: Rhonchi present.  Musculoskeletal:        General: Normal range of motion.     Cervical back: Normal range of motion.  Skin:    General: Skin is warm and dry.  Neurological:     Mental Status: He is alert.  Psychiatric:        Mood and Affect: Mood normal.      UC Treatments / Results  Labs (all labs ordered are listed, but only abnormal results are displayed) Labs Reviewed  NOVEL CORONAVIRUS, NAA (HOSP  ORDER, SEND-OUT TO REF LAB; TAT 18-24 HRS)    EKG   Radiology DG Chest 2 View  Result Date: 07/15/2019 CLINICAL DATA:  Shortness of breath, cough EXAM: CHEST - 2 VIEW COMPARISON:  07/26/2017 FINDINGS: The heart size and mediastinal contours are within normal limits. Both lungs are clear. The visualized skeletal structures are unremarkable. IMPRESSION: No active cardiopulmonary disease. Electronically Signed   By: Kathreen Devoid   On: 07/15/2019 10:57    Procedures Procedures (including critical care time)  Medications Ordered in UC Medications - No data to display  Initial Impression / Assessment and Plan / UC Course  I have reviewed the triage vital signs and the nursing notes.  Pertinent labs & imaging results that were available during my care of the patient were reviewed by me and considered in my medical decision making (see chart for details).     Acute bronchitis-chest x-ray without any acute abnormalities. Will treat with prednisone x5 days, albuterol inhaler as needed for cough, wheezing or shortness of breath and guaifenesin for cough and expectorant. Follow up as needed for continued or worsening symptoms  Final Clinical Impressions(s) / UC Diagnoses   Final diagnoses:  Acute bronchitis, unspecified organism     Discharge Instructions     Treating you for bronchitis. No pneumonia on x-ray Take the medication as prescribed.  If your symptoms continue or worsen you need to follow-up.    ED Prescriptions    Medication Sig Dispense Auth. Provider   guaiFENesin (ROBITUSSIN) 100 MG/5ML liquid Take 5-10 mLs (100-200 mg total) by mouth every 4 (four) hours as needed for cough. 60 mL Allyna Pittsley A, NP   predniSONE (DELTASONE) 10 MG tablet Take 4 tablets (40 mg total) by mouth daily for 5 days. 20 tablet Nikeshia Keetch A, NP   albuterol (VENTOLIN HFA) 108 (90 Base) MCG/ACT inhaler Inhale 1-2 puffs into the lungs every 6 (six) hours as needed for wheezing or shortness of  breath. 18 g Loura Halt A, NP     PDMP not reviewed this encounter.   Loura Halt A, NP 07/16/19 [phone]

## 2019-07-17 LAB — NOVEL CORONAVIRUS, NAA (HOSP ORDER, SEND-OUT TO REF LAB; TAT 18-24 HRS): SARS-CoV-2, NAA: NOT DETECTED

## 2019-08-11 ENCOUNTER — Other Ambulatory Visit: Payer: Self-pay

## 2019-08-11 ENCOUNTER — Ambulatory Visit (HOSPITAL_COMMUNITY)
Admission: EM | Admit: 2019-08-11 | Discharge: 2019-08-11 | Disposition: A | Discharge status: Home / Self Care | Payer: BC Managed Care – PPO | Attending: Family Medicine | Admitting: Family Medicine

## 2019-08-11 ENCOUNTER — Encounter (HOSPITAL_COMMUNITY): Payer: Self-pay

## 2019-08-11 DIAGNOSIS — R21 Rash and other nonspecific skin eruption: Secondary | ICD-10-CM

## 2019-08-11 DIAGNOSIS — L2389 Allergic contact dermatitis due to other agents: Secondary | ICD-10-CM

## 2019-08-11 MED ORDER — DEXAMETHASONE SODIUM PHOSPHATE 10 MG/ML IJ SOLN
10.0000 mg | Freq: Once | INTRAMUSCULAR | Status: AC
Start: 2019-08-11 — End: 2019-08-11
  Administered 2019-08-11: 10 mg via INTRAMUSCULAR

## 2019-08-11 MED ORDER — PREDNISONE 10 MG (21) PO TBPK: ORAL_TABLET | Freq: Every day | ORAL | 0 refills | Status: AC

## 2019-08-11 MED ORDER — DEXAMETHASONE SODIUM PHOSPHATE 10 MG/ML IJ SOLN
INTRAMUSCULAR | Status: AC
  Filled 2019-08-11: qty 1

## 2019-08-11 MED ORDER — CLOBETASOL PROPIONATE 0.05 % EX GEL: CUTANEOUS | 0 refills | Status: DC

## 2019-08-11 MED ORDER — LEVOCETIRIZINE DIHYDROCHLORIDE 5 MG PO TABS: 5.0000 mg | ORAL_TABLET | Freq: Every evening | ORAL | 0 refills | Status: DC

## 2019-08-11 NOTE — ED Provider Notes (Signed)
[place]    CSN: [id number] Arrival date & time: 08/11/19  0806      History   Chief Complaint Chief Complaint  Patient presents with  . Rash    HPI [name] is a 34 y.o. male.   Reports that he has had rash to his face, shoulders, scalp, groin, abdomen for the last 2 days.  Reports that he has not tried anything at home to treat his rash.  Reports this rash is very itchy.  Denies new contacts like detergent, soap, shampoo, shaving cream.  Reports that he has not had a rash like this before, so he does not know what caused it.  Reports that he does have some seasonal allergies and asthma.  Denies any cough, shortness of breath, tongue swelling, lip swelling.  ROS per HPI  The history is provided by the patient.    Past Medical History:  Diagnosis Date  . Foreign body in finger-infected 2000  . Stye     There are no problems to display for this patient.   Past Surgical History:  Procedure Laterality Date  . INCISION AND DRAINAGE WOUND WITH FOREIGN BODY REMOVAL  06/15/2012   Procedure: INCISION AND DRAINAGE WOUND WITH FOREIGN BODY REMOVAL;  Surgeon: Roseanne Kaufman, MD;  Location: [facility];  Service: Orthopedics;  Laterality: Left;  index        Home Medications    Prior to Admission medications   Medication Sig Start Date End Date Taking? Authorizing Provider  albuterol (VENTOLIN HFA) 108 (90 Base) MCG/ACT inhaler Inhale 1-2 puffs into the lungs every 6 (six) hours as needed for wheezing or shortness of breath. 07/15/19   Loura Halt A, NP  clobetasol (TEMOVATE) 0.05 % GEL Apply to affected area twice daily as needed for rash itching and burning 08/11/19   Faustino Congress, NP  guaiFENesin (ROBITUSSIN) 100 MG/5ML liquid Take 5-10 mLs (100-200 mg total) by mouth every 4 (four) hours as needed for cough. 07/15/19   Loura Halt A, NP  levocetirizine (XYZAL) 5 MG tablet Take 1 tablet (5 mg total) by mouth every evening. 08/11/19 09/10/19  Faustino Congress, NP    predniSONE (STERAPRED UNI-PAK 21 TAB) 10 MG (21) TBPK tablet Take by mouth daily for 6 days. Take 6 tablets on day 1, 5 tablets on day 2, 4 tablets on day 3, 3 tablets on day 4, 2 tablets on day 5, 1 tablet on day 6 08/11/19 08/17/19  Faustino Congress, NP    Family History Family History  Problem Relation Age of Onset  . Cancer Mother   . Diabetes Father     Social History Social History   Tobacco Use  . Smoking status: Current Every Day Smoker    Years: 2.00    Types: Cigars  . Smokeless tobacco: Never Used  . Tobacco comment: 3 cigars a day  Substance Use Topics  . Alcohol use: Yes    Comment: 2 drink/ day  . Drug use: No     Allergies   Patient has no known allergies.   Review of Systems Review of Systems   Physical Exam Triage Vital Signs ED Triage Vitals [08/11/19 0830]  Enc Vitals Group     BP 123/83     Pulse Rate 61     Resp 18     Temp 98.4 F (36.9 C)     Temp Source Oral     SpO2 100 %     Weight  Height      Head Circumference      Peak Flow      Pain Score 0     Pain Loc      Pain Edu?      Excl. in GC?    No data found.  Updated Vital Signs BP 123/83 (BP Location: Left Arm)   Pulse 61   Temp 98.4 F (36.9 C) (Oral)   Resp 18   SpO2 100%   Visual Acuity Right Eye Distance:   Left Eye Distance:   Bilateral Distance:    Right Eye Near:   Left Eye Near:    Bilateral Near:     Physical Exam Vitals and nursing note reviewed.  Constitutional:      General: He is not in acute distress.    Appearance: He is well-developed and normal weight.  HENT:     Head: Normocephalic and atraumatic.     Nose: Nose normal.  Eyes:     Conjunctiva/sclera: Conjunctivae normal.  Cardiovascular:     Rate and Rhythm: Normal rate and regular rhythm.     Heart sounds: Normal heart sounds. No murmur.  Pulmonary:     Effort: Pulmonary effort is normal. No respiratory distress.     Breath sounds: Normal breath sounds. No stridor. No wheezing,  rhonchi or rales.  Abdominal:     Palpations: Abdomen is soft.     Tenderness: There is no abdominal tenderness.  Musculoskeletal:        General: Normal range of motion.     Cervical back: Neck supple.  Skin:    General: Skin is warm and dry.     Capillary Refill: Capillary refill takes less than 2 seconds.     Findings: Rash present. Rash is papular and urticarial.          Comments: Areas of erythematous, urticarial, maculopapular rash  Neurological:     General: No focal deficit present.     Mental Status: He is alert and oriented to person, place, and time.  Psychiatric:        Mood and Affect: Mood normal.        Behavior: Behavior normal.      UC Treatments / Results  Labs (all labs ordered are listed, but only abnormal results are displayed) Labs Reviewed - No data to display  EKG   Radiology No results found.  Procedures Procedures (including critical care time)  Medications Ordered in UC Medications  dexamethasone (DECADRON) injection 10 mg (10 mg Intramuscular Given 08/11/19 0846)    Initial Impression / Assessment and Plan / UC Course  I have reviewed the triage vital signs and the nursing notes.  Pertinent labs & imaging results that were available during my care of the patient were reviewed by me and considered in my medical decision making (see chart for details).     Likely contact dermatitis.  Received Decadron 10 mg IM in office today.  To start prednisone pack taper tomorrow.  To start taking Xyzal for the next 2 weeks, and clobetasol to use on rash from the neck down.  Keep skin moisturized as rash is healing.  Follow-up if symptoms persist with primary care or with our office, as needed. Final Clinical Impressions(s) / UC Diagnoses   Final diagnoses:  Rash  Allergic contact dermatitis due to other agents     Discharge Instructions     Can use Aquaphor, Vaseline, or Eucerin cream with clobetasol to moisturize the skin.  Asthma  and  allergy clinic phone number below.   I have sent in oral and topical steroids for you to take to help relieve the itching.   Take the Xyzal (antihistamine) daily.  Go to the ER for facial swelling, shortness of breath.     ED Prescriptions    Medication Sig Dispense Auth. Provider   clobetasol (TEMOVATE) 0.05 % GEL Apply to affected area twice daily as needed for rash itching and burning 30 g Moshe Cipro, NP   levocetirizine (XYZAL) 5 MG tablet Take 1 tablet (5 mg total) by mouth every evening. 30 tablet Moshe Cipro, NP   predniSONE (STERAPRED UNI-PAK 21 TAB) 10 MG (21) TBPK tablet Take by mouth daily for 6 days. Take 6 tablets on day 1, 5 tablets on day 2, 4 tablets on day 3, 3 tablets on day 4, 2 tablets on day 5, 1 tablet on day 6 21 tablet Moshe Cipro, NP     I have reviewed the PDMP during this encounter.   Moshe Cipro, NP 08/11/19 1000

## 2019-08-11 NOTE — Discharge Instructions (Signed)
Can use Aquaphor, Vaseline, or Eucerin cream with clobetasol to moisturize the skin.  Asthma and allergy clinic phone number below.   I have sent in oral and topical steroids for you to take to help relieve the itching.   Take the Xyzal (antihistamine) daily.  Go to the ER for facial swelling, shortness of breath.

## 2019-08-11 NOTE — ED Triage Notes (Signed)
Pt presents with a rash (allergic reaction?) that started on his face about 5 days ago and has progressed to different areas of his body.

## 2020-04-05 ENCOUNTER — Other Ambulatory Visit: Payer: Self-pay

## 2020-04-05 ENCOUNTER — Encounter (HOSPITAL_COMMUNITY): Payer: Self-pay

## 2020-04-05 ENCOUNTER — Ambulatory Visit (HOSPITAL_COMMUNITY)
Admission: RE | Admit: 2020-04-05 | Discharge: 2020-04-05 | Disposition: A | Discharge status: Home / Self Care | Payer: BC Managed Care – PPO | Source: Ambulatory Visit | Attending: Internal Medicine | Admitting: Internal Medicine

## 2020-04-05 VITALS — BP 138/83 | HR 91 | Temp 98.5°F | Resp 18

## 2020-04-05 DIAGNOSIS — L232 Allergic contact dermatitis due to cosmetics: Secondary | ICD-10-CM | POA: Diagnosis not present

## 2020-04-05 MED ORDER — PREDNISONE 20 MG PO TABS: 20.0000 mg | ORAL_TABLET | Freq: Every day | ORAL | 0 refills | Status: AC

## 2020-04-05 MED ORDER — HYDROXYZINE HCL 25 MG PO TABS: 25.0000 mg | ORAL_TABLET | Freq: Four times a day (QID) | ORAL | 0 refills | Status: AC

## 2020-04-05 NOTE — Discharge Instructions (Signed)
Use mild body lotion to help with healing If symptoms worsen ,please return to urgent care to be re-evaluated.

## 2020-04-05 NOTE — ED Triage Notes (Signed)
Pt reports rash all over the body x 3-4 days. States having itchiness. Pt started developing the rash after a new soap he used.

## 2020-04-05 NOTE — ED Provider Notes (Signed)
[facility]    CSN: [id number] Arrival date & time: 04/05/20  1609      History   Chief Complaint Chief Complaint  Patient presents with  . Appointment    1600    HPI [name] is a 34 y.o. male comes to the urgent care with complaint of generalized rash with itching over the past 3 to 4 days.  About 5 days ago patient bought a new bar of soap from the [employer] and started using it.  The day after he started using the bathing soap he experienced generalized rash with itching.  The rash and itching has been persistent over the past 3 to 4 days.  No redness of his eye.  No ulcerations.  HPI  Past Medical History:  Diagnosis Date  . Foreign body in finger-infected 2000  . Stye     There are no problems to display for this patient.   Past Surgical History:  Procedure Laterality Date  . INCISION AND DRAINAGE WOUND WITH FOREIGN BODY REMOVAL  06/15/2012   Procedure: INCISION AND DRAINAGE WOUND WITH FOREIGN BODY REMOVAL;  Surgeon: Dominica Severin, MD;  Location: [facility] OR;  Service: Orthopedics;  Laterality: Left;  index        Home Medications    Prior to Admission medications   Medication Sig Start Date End Date Taking? Authorizing Provider  hydrOXYzine (ATARAX/VISTARIL) 25 MG tablet Take 1 tablet (25 mg total) by mouth every 6 (six) hours. 04/05/20   Ermal Brzozowski, Britta Mccreedy, MD  predniSONE (DELTASONE) 20 MG tablet Take 1 tablet (20 mg total) by mouth daily for 5 days. 04/05/20 04/10/20  LampteyBritta Mccreedy, MD  albuterol (VENTOLIN HFA) 108 (90 Base) MCG/ACT inhaler Inhale 1-2 puffs into the lungs every 6 (six) hours as needed for wheezing or shortness of breath. 07/15/19 04/05/20  Dahlia Byes A, NP  levocetirizine (XYZAL) 5 MG tablet Take 1 tablet (5 mg total) by mouth every evening. 08/11/19 04/05/20  Moshe Cipro, NP    Family History Family History  Problem Relation Age of Onset  . Cancer Mother   . Diabetes Father     Social History Social History     Tobacco Use  . Smoking status: Current Every Day Smoker    Years: 2.00    Types: Cigars  . Smokeless tobacco: Never Used  . Tobacco comment: 3 cigars a day  Substance Use Topics  . Alcohol use: Yes    Comment: 2 drink/ day  . Drug use: No     Allergies   Patient has no known allergies.   Review of Systems Review of Systems  HENT: Negative.   Respiratory: Negative.   Gastrointestinal: Negative.   Skin: Positive for rash. Negative for color change and wound.  Neurological: Negative.      Physical Exam Triage Vital Signs ED Triage Vitals  Enc Vitals Group     BP 04/05/20 1659 138/83     Pulse Rate 04/05/20 1659 91     Resp 04/05/20 1659 18     Temp 04/05/20 1659 98.5 F (36.9 C)     Temp Source 04/05/20 1659 Oral     SpO2 04/05/20 1659 100 %     Weight --      Height --      Head Circumference --      Peak Flow --      Pain Score 04/05/20 1700 0     Pain Loc --  Pain Edu? --      Excl. in GC? --    No data found.  Updated Vital Signs BP 138/83 (BP Location: Left Arm)   Pulse 91   Temp 98.5 F (36.9 C) (Oral)   Resp 18   SpO2 100%   Visual Acuity Right Eye Distance:   Left Eye Distance:   Bilateral Distance:    Right Eye Near:   Left Eye Near:    Bilateral Near:     Physical Exam Vitals and nursing note reviewed.  Constitutional:      General: He is not in acute distress.    Appearance: Normal appearance. He is not ill-appearing.  Cardiovascular:     Rate and Rhythm: Normal rate and regular rhythm.     Pulses: Normal pulses.     Heart sounds: Normal heart sounds.  Pulmonary:     Effort: Pulmonary effort is normal.     Breath sounds: Normal breath sounds.  Skin:    Comments: Generalized papular rash over the torso and extremities as well as the periorbital areas.  No surrounding erythema.  Few areas of excoriation secondary to scratching.  Neurological:     Mental Status: He is alert.      UC Treatments / Results  Labs (all  labs ordered are listed, but only abnormal results are displayed) Labs Reviewed - No data to display  EKG   Radiology No results found.  Procedures Procedures (including critical care time)  Medications Ordered in UC Medications - No data to display  Initial Impression / Assessment and Plan / UC Course  I have reviewed the triage vital signs and the nursing notes.  Pertinent labs & imaging results that were available during my care of the patient were reviewed by me and considered in my medical decision making (see chart for details).     1.  Allergic contact dermatitis secondary to cosmetics: Prednisone 20 mg orally daily for 5 days Hydroxyzine 25 mg every 8 hours as needed for itching If patient notices worsening rash, redness, purulence or abscess formation-he is advised to return to the urgent care to be reevaluated. Avoid using the bathing soap Use nonscented body lotion to help with the skin healing. Final Clinical Impressions(s) / UC Diagnoses   Final diagnoses:  Allergic contact dermatitis due to cosmetics     Discharge Instructions     Use mild body lotion to help with healing If symptoms worsen ,please return to urgent care to be re-evaluated.   ED Prescriptions    Medication Sig Dispense Auth. Provider   predniSONE (DELTASONE) 20 MG tablet Take 1 tablet (20 mg total) by mouth daily for 5 days. 5 tablet Alie Hardgrove, Britta Mccreedy, MD   hydrOXYzine (ATARAX/VISTARIL) 25 MG tablet Take 1 tablet (25 mg total) by mouth every 6 (six) hours. 12 tablet Alece Koppel, Britta Mccreedy, MD     PDMP not reviewed this encounter.   Merrilee Jansky, MD 04/05/20 2007

## 2020-06-07 IMAGING — DX DG CHEST 2V
2 series · 2 of 2 positions shown · non-contrast
Comparison: 07/26/2017

CLINICAL DATA: Shortness of breath, cough

EXAM:
CHEST - 2 VIEW

[chest pa]
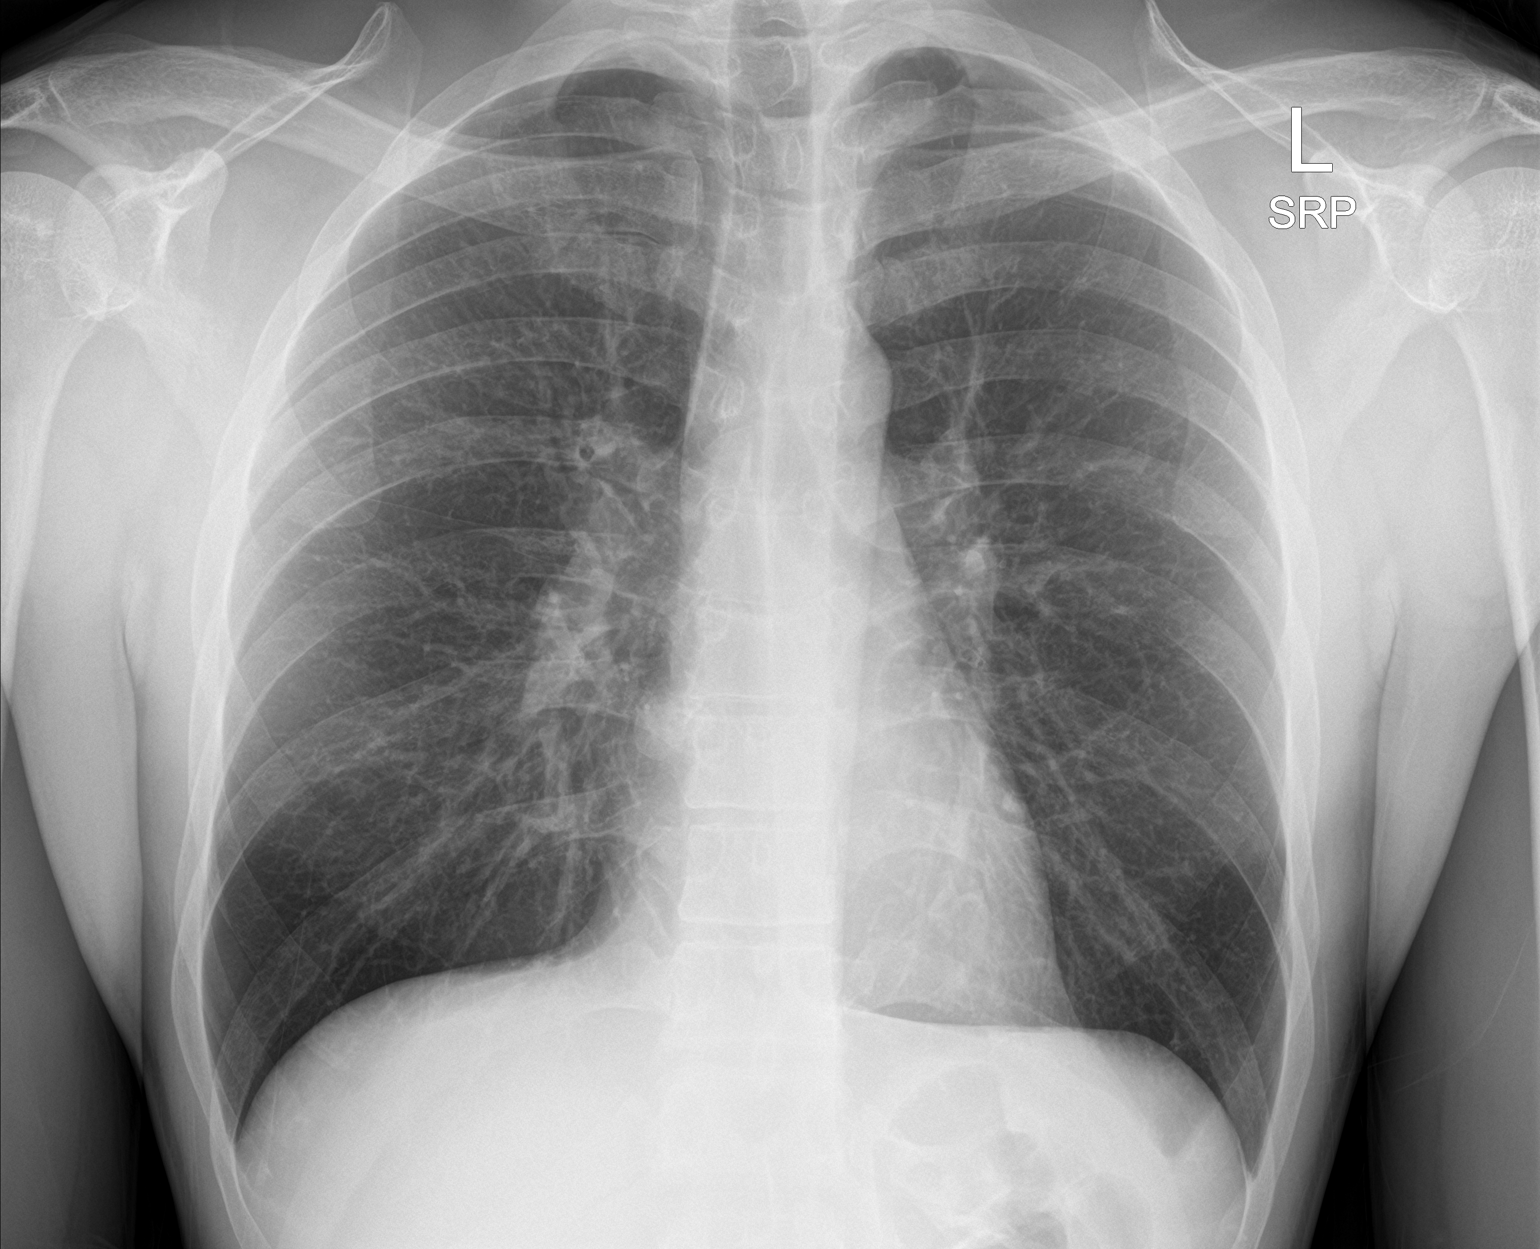

[chest lat]
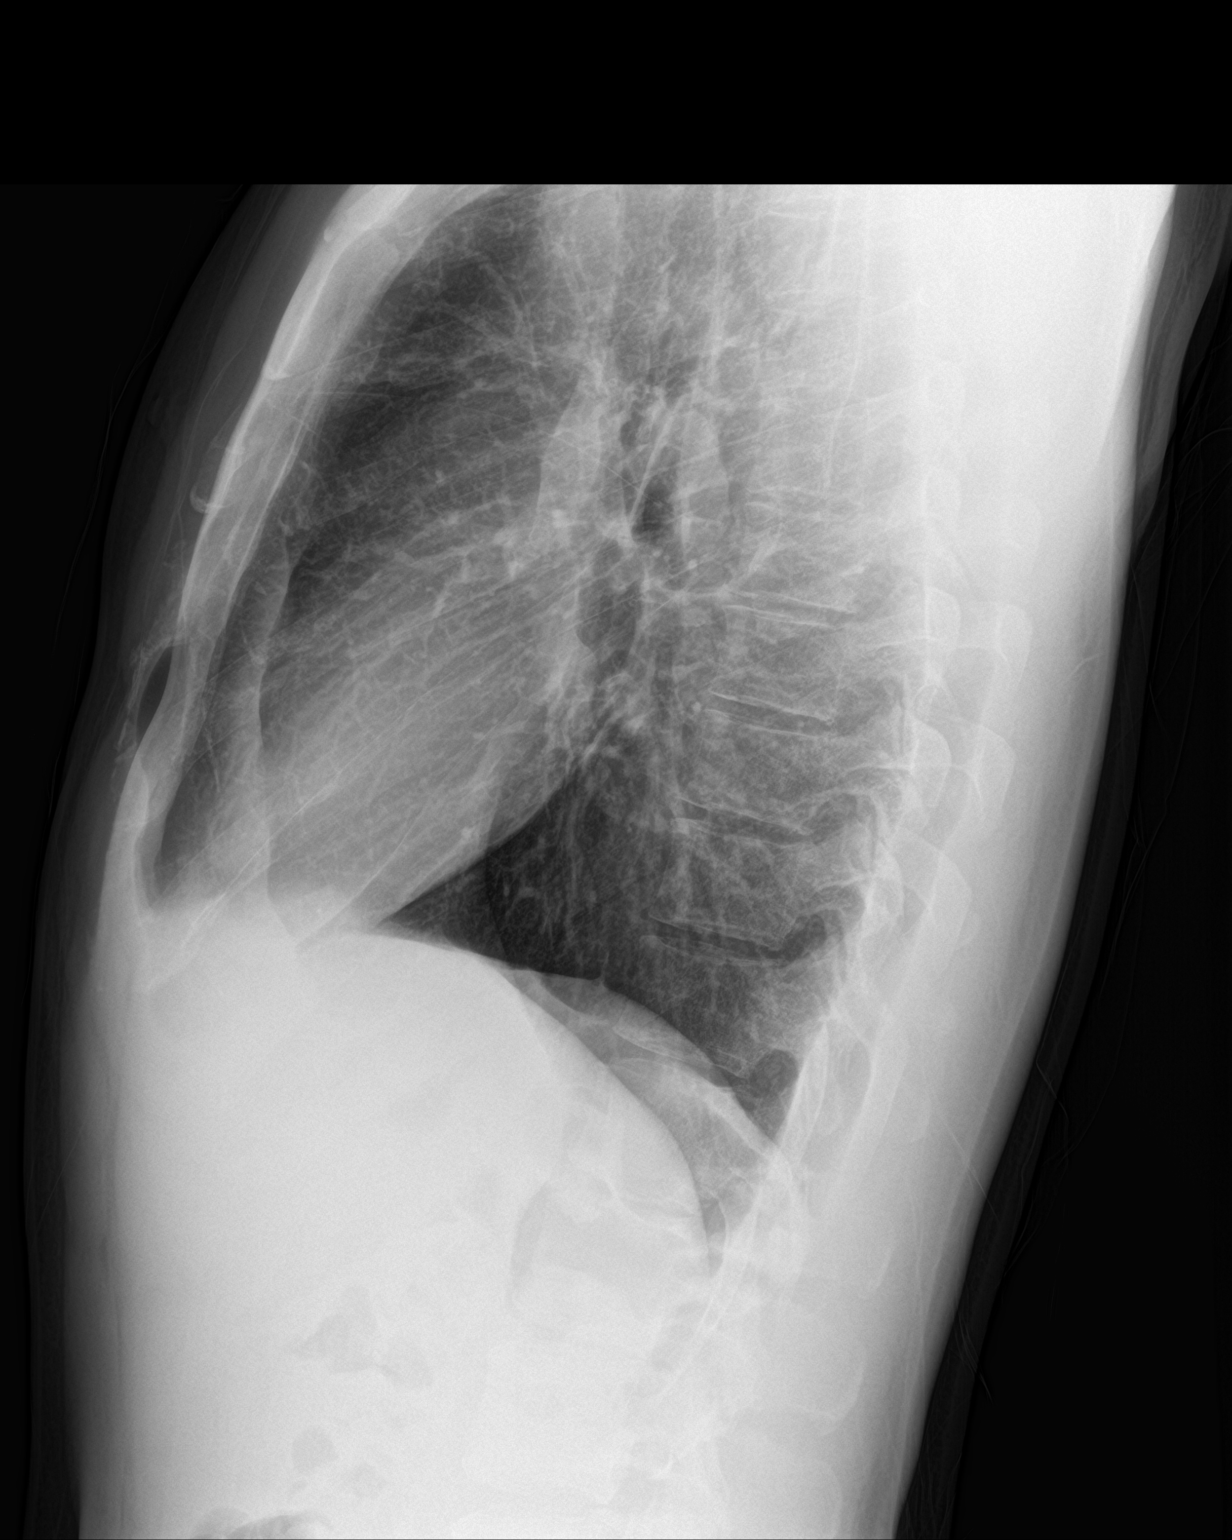

[2 of 2 positions shown; findings below may reference images not displayed]

FINDINGS: The heart size and mediastinal contours are within normal limits.
Both lungs are clear. The visualized skeletal structures are
unremarkable.
IMPRESSION: No active cardiopulmonary disease.

## 2020-06-29 ENCOUNTER — Ambulatory Visit (HOSPITAL_COMMUNITY): Admit: 2020-06-29 | Discharge status: Against Medical Advice | Payer: BC Managed Care – PPO

## 2020-06-29 ENCOUNTER — Ambulatory Visit (INDEPENDENT_AMBULATORY_CARE_PROVIDER_SITE_OTHER): Payer: BC Managed Care – PPO

## 2020-06-29 ENCOUNTER — Encounter: Payer: Self-pay | Admitting: Podiatry

## 2020-06-29 ENCOUNTER — Other Ambulatory Visit: Payer: Self-pay

## 2020-06-29 ENCOUNTER — Ambulatory Visit: Payer: BC Managed Care – PPO | Admitting: Podiatry

## 2020-06-29 ENCOUNTER — Telehealth: Payer: Self-pay | Admitting: Podiatry

## 2020-06-29 DIAGNOSIS — S99921A Unspecified injury of right foot, initial encounter: Secondary | ICD-10-CM | POA: Diagnosis not present

## 2020-06-29 DIAGNOSIS — W009XXA Unspecified fall due to ice and snow, initial encounter: Secondary | ICD-10-CM

## 2020-06-29 DIAGNOSIS — S9031XA Contusion of right foot, initial encounter: Secondary | ICD-10-CM | POA: Diagnosis not present

## 2020-06-29 MED ORDER — MELOXICAM 15 MG PO TABS: 15.0000 mg | ORAL_TABLET | Freq: Every day | ORAL | 0 refills | Status: AC

## 2020-06-29 MED ORDER — HYDROCODONE-ACETAMINOPHEN 5-325 MG PO TABS: 1.0000 | ORAL_TABLET | Freq: Four times a day (QID) | ORAL | 0 refills | Status: AC | PRN

## 2020-06-29 MED ORDER — HYDROCODONE-ACETAMINOPHEN 5-325 MG PO TABS: 1.0000 | ORAL_TABLET | Freq: Four times a day (QID) | ORAL | 0 refills | Status: DC | PRN

## 2020-06-29 NOTE — Telephone Encounter (Signed)
Patient stated he was suppose to have 3 prescriptions called but only 2 were called, Please Advise

## 2020-06-29 NOTE — Telephone Encounter (Signed)
I called patient back- I only sent in 2 prescriptions.

## 2020-06-30 NOTE — Progress Notes (Signed)
Subjective:   Patient ID: [name], male   DOB: 35 y.o.   MRN: [id number]   HPI 35 year old male presents the office today after an injury he sustained this morning while slipping on the ice.  He states that he fell in his foot was pushed back underneath his leg and since then he has had some numbness to the toes and diffuse tenderness of the foot and ankle.  He presents today using crutches staying off of his foot.  No immediate treatment.  He was to be seen in urgent care but instead made an appointment with [place] today.   Review of Systems  All other systems reviewed and are negative.  Past Medical History:  Diagnosis Date  . Foreign body in finger-infected 2000  . Stye     Past Surgical History:  Procedure Laterality Date  . INCISION AND DRAINAGE WOUND WITH FOREIGN BODY REMOVAL  06/15/2012   Procedure: INCISION AND DRAINAGE WOUND WITH FOREIGN BODY REMOVAL;  Surgeon: Dominica Severin, MD;  Location: [facility];  Service: Orthopedics;  Laterality: Left;  index      Current Outpatient Medications:  .  meloxicam (MOBIC) 15 MG tablet, Take 1 tablet (15 mg total) by mouth daily., Disp: 30 tablet, Rfl: 0 .  HYDROcodone-acetaminophen (NORCO/VICODIN) 5-325 MG tablet, Take 1 tablet by mouth every 6 (six) hours as needed., Disp: 10 tablet, Rfl: 0 .  hydrOXYzine (ATARAX/VISTARIL) 25 MG tablet, Take 1 tablet (25 mg total) by mouth every 6 (six) hours., Disp: 12 tablet, Rfl: 0  No Known Allergies       Objective:  Physical Exam  General: AAO x3, NAD  Dermatological: Skin is warm, dry and supple bilateral. There are no open sores, no preulcerative lesions, no rash or signs of infection present.  Vascular: Dorsalis Pedis artery and Posterior Tibial artery pedal pulses are 2/4 bilateral with immedate capillary fill time.  There is no pain with calf compression, swelling, warmth, erythema.   Neruologic: Grossly intact via light touch bilateral.   Musculoskeletal: There is diffuse tenderness to  the right foot and ankle.  There is minimal edema.  There is no proximal tib-fib pain.  He is able to move the digits and ankle although limited due to guarding.  Is difficult to fully assess muscular strength due to guarding.    Gait: Unassisted, Nonantalgic.       Assessment:   Right foot, ankle injury status post fall on ice     Plan:  -Treatment options discussed including all alternatives, risks, and complications -Etiology of symptoms were discussed -X-rays of the foot and ankle obtained and reviewed there is no definitive evidence of acute fracture identified at this time. -Recommend immobilization in a cam boot which was dispensed today.  Continue nonweightbearing.  Prescribed Vicodin for pain as well as noted to take as needed.  Ice and elevation. -If continued symptoms next appointment possible MRI however it was difficult to fully evaluate today given the guarding due to pain.  Vivi Barrack DPM

## 2020-07-13 ENCOUNTER — Ambulatory Visit: Payer: BC Managed Care – PPO | Admitting: Podiatry

## 2020-07-27 ENCOUNTER — Ambulatory Visit: Payer: BC Managed Care – PPO | Admitting: Podiatry

## 2020-07-27 ENCOUNTER — Other Ambulatory Visit: Payer: Self-pay

## 2020-07-27 DIAGNOSIS — S93401S Sprain of unspecified ligament of right ankle, sequela: Secondary | ICD-10-CM

## 2020-07-27 DIAGNOSIS — L600 Ingrowing nail: Secondary | ICD-10-CM

## 2020-07-27 DIAGNOSIS — W009XXD Unspecified fall due to ice and snow, subsequent encounter: Secondary | ICD-10-CM

## 2020-07-27 NOTE — Patient Instructions (Signed)
For instructions on how to put on your Tri-Lock Ankle Brace, please visit [email or web address] 

## 2020-07-31 NOTE — Progress Notes (Signed)
Subjective: 35 year old male presents the office today for follow-up evaluation of right ankle injury after falling on ice.  He presents today wearing a regular shoe but states that overall he is doing better.  He gets some discomfort mostly first in the morning when he first gets up.  Also has discomfort after being on his feet all day but overall improved.  He also secondary concerns of a possible ingrown toe of the right big toe but this been a chronic issue for him and currently denies any redness or drainage.  He has no other concerns. Denies any systemic complaints such as fevers, chills, nausea, vomiting. No acute changes since last appointment, and no other complaints at this time.   Objective: AAO x3, NAD DP/PT pulses palpable bilaterally, CRT less than 3 seconds On today's exam there is mild discomfort mostly along the ATFL and the lateral aspect of the ankle.  There is no gross ankle instability present.  Mild discomfort along the flexor tendons posterior to the medial malleolus but overall the tendon appears to be intact as well.  There is no significant edema is no erythema.  Ankle, subtalar, midtarsal range of motion intact.  MMT 5/5. Incurvation present to the right medial hallux nail border without any edema, erythema.  No drainage or pus.  The nails hypertrophic, dystrophic. No pain with calf compression, swelling, warmth, erythema  Assessment: 35 year old male with resolving left ankle injury, sprain; ingrown toenail  Plan: -All treatment options discussed with the patient including all alternatives, risks, complications.  -Overall the symptoms from the injury are improving.  I discussed with him range of motion exercises that he can do to help strengthen the ankle.  Ankle brace as needed which was dispensed today.  Continue with supportive shoes.  If symptoms continue recommend MRI but overall improving. -Debride the symptomatic portion of the ingrown toenails and complications  reviewed.  Likely AP nail removal next appointment. -Patient encouraged to call the office with any questions, concerns, change in symptoms.   Vivi Barrack DPM

## 2020-08-17 ENCOUNTER — Ambulatory Visit: Payer: BC Managed Care – PPO | Admitting: Podiatry
